# Patient Record
Sex: Male | Born: 1950 | Race: Black or African American | Hispanic: No | Marital: Single | Smoking: Former smoker
Health system: Southern US, Community
[De-identification: ages and names within clinical notes are randomized; demographics above are authoritative.]

## PROBLEM LIST (undated history)

## (undated) DIAGNOSIS — E119 Type 2 diabetes mellitus without complications: Secondary | ICD-10-CM

## (undated) DIAGNOSIS — E785 Hyperlipidemia, unspecified: Secondary | ICD-10-CM

## (undated) DIAGNOSIS — I10 Essential (primary) hypertension: Secondary | ICD-10-CM

## (undated) HISTORY — PX: KNEE SURGERY: SHX244

---

## 2005-03-15 ENCOUNTER — Ambulatory Visit: Payer: Self-pay | Admitting: Internal Medicine

## 2005-05-23 ENCOUNTER — Ambulatory Visit: Payer: Self-pay | Admitting: Family Medicine

## 2005-07-24 ENCOUNTER — Ambulatory Visit: Payer: Self-pay | Admitting: Internal Medicine

## 2006-02-28 ENCOUNTER — Ambulatory Visit: Payer: Self-pay | Admitting: *Deleted

## 2006-03-12 ENCOUNTER — Ambulatory Visit: Payer: Self-pay | Admitting: Internal Medicine

## 2006-09-18 ENCOUNTER — Ambulatory Visit: Payer: Self-pay | Admitting: Internal Medicine

## 2007-02-08 ENCOUNTER — Ambulatory Visit: Payer: Self-pay | Admitting: Internal Medicine

## 2007-05-22 ENCOUNTER — Encounter (INDEPENDENT_AMBULATORY_CARE_PROVIDER_SITE_OTHER): Payer: Self-pay | Admitting: *Deleted

## 2007-10-04 ENCOUNTER — Ambulatory Visit: Payer: Self-pay | Admitting: Family Medicine

## 2007-12-23 ENCOUNTER — Encounter: Payer: Self-pay | Admitting: Family Medicine

## 2007-12-23 ENCOUNTER — Ambulatory Visit: Payer: Self-pay | Admitting: Internal Medicine

## 2007-12-23 LAB — CONVERTED CEMR LAB
Albumin: 4.5 g/dL (ref 3.5–5.2)
CO2: 24 meq/L (ref 19–32)
Calcium: 9.7 mg/dL (ref 8.4–10.5)
Cholesterol: 180 mg/dL (ref 0–200)
Eosinophils Relative: 2 % (ref 0–5)
Glucose, Bld: 84 mg/dL (ref 70–99)
HCT: 41.1 % (ref 39.0–52.0)
Lymphocytes Relative: 27 % (ref 12–46)
Lymphs Abs: 2.7 10*3/uL (ref 0.7–4.0)
Microalb, Ur: 0.62 mg/dL (ref 0.00–1.89)
PSA: 0.28 ng/mL (ref 0.10–4.00)
Platelets: 280 10*3/uL (ref 150–400)
Potassium: 4.4 meq/L (ref 3.5–5.3)
Sodium: 138 meq/L (ref 135–145)
Total Bilirubin: 0.4 mg/dL (ref 0.3–1.2)
Total Protein: 7.7 g/dL (ref 6.0–8.3)
Triglycerides: 142 mg/dL (ref <150)
WBC: 9.8 10*3/uL (ref 4.0–10.5)

## 2008-01-01 ENCOUNTER — Ambulatory Visit: Payer: Self-pay | Admitting: Internal Medicine

## 2008-06-24 ENCOUNTER — Ambulatory Visit: Payer: Self-pay | Admitting: Family Medicine

## 2016-03-25 ENCOUNTER — Emergency Department (HOSPITAL_BASED_OUTPATIENT_CLINIC_OR_DEPARTMENT_OTHER)
Admission: EM | Admit: 2016-03-25 | Discharge: 2016-03-25 | Disposition: A | Discharge status: Home / Self Care | Payer: BLUE CROSS/BLUE SHIELD | Attending: Emergency Medicine | Admitting: Emergency Medicine

## 2016-03-25 ENCOUNTER — Encounter (HOSPITAL_BASED_OUTPATIENT_CLINIC_OR_DEPARTMENT_OTHER): Payer: Self-pay | Admitting: *Deleted

## 2016-03-25 DIAGNOSIS — F1110 Opioid abuse, uncomplicated: Secondary | ICD-10-CM | POA: Diagnosis present

## 2016-03-25 DIAGNOSIS — Z87891 Personal history of nicotine dependence: Secondary | ICD-10-CM | POA: Insufficient documentation

## 2016-03-25 DIAGNOSIS — Z7984 Long term (current) use of oral hypoglycemic drugs: Secondary | ICD-10-CM | POA: Insufficient documentation

## 2016-03-25 DIAGNOSIS — E119 Type 2 diabetes mellitus without complications: Secondary | ICD-10-CM | POA: Insufficient documentation

## 2016-03-25 DIAGNOSIS — I10 Essential (primary) hypertension: Secondary | ICD-10-CM | POA: Insufficient documentation

## 2016-03-25 DIAGNOSIS — E785 Hyperlipidemia, unspecified: Secondary | ICD-10-CM | POA: Diagnosis not present

## 2016-03-25 DIAGNOSIS — Z79899 Other long term (current) drug therapy: Secondary | ICD-10-CM | POA: Insufficient documentation

## 2016-03-25 HISTORY — DX: Hyperlipidemia, unspecified: E78.5

## 2016-03-25 HISTORY — DX: Type 2 diabetes mellitus without complications: E11.9

## 2016-03-25 HISTORY — DX: Essential (primary) hypertension: I10

## 2016-03-25 NOTE — ED Notes (Signed)
Pt reports 3 year addiction to methadone pills that he has been buying on the street. Pt states he would like to go through rehab. Last dose was taken this morning. Denies pain.

## 2016-03-25 NOTE — ED Provider Notes (Signed)
CSN: [id number]     Arrival date & time 03/25/16  1311 History   First MD Initiated Contact with Patient 03/25/16 1328     Chief Complaint  Patient presents with  . Addiction Problem     (Consider location/radiation/quality/duration/timing/severity/associated sxs/prior Treatment) HPI   Patient is a 65 year old male past medical history of diabetes, hypertension and hyperlipidemia presents to the ED requesting detox from methadone. Patient reports he has been addicted to methadone pills for the past 3 years which he states he has been buying off the street. He notes he typically takes three 10 mg tablets daily. He notes he last took 10 mg this morning. Patient denies any pain or complaints at this time. Denies alcohol or IV drug use. Patient denies using any other drugs. Denies smoking.   Past Medical History  Diagnosis Date  . Diabetes mellitus without complication (HCC)   . Hyperlipidemia   . Hypertension    Past Surgical History  Procedure Laterality Date  . Knee surgery     History reviewed. No pertinent family history. Social History  Substance Use Topics  . Smoking status: Former Games developer  . Smokeless tobacco: None  . Alcohol Use: No    Review of Systems  All other systems reviewed and are negative.     Allergies  Review of patient's allergies indicates no known allergies.  Home Medications   Prior to Admission medications   Medication Sig Start Date End Date Taking? Authorizing Provider  fenofibrate (TRICOR) 48 MG tablet Take 48 mg by mouth daily.   Yes Historical Provider, MD  lisinopril (PRINIVIL,ZESTRIL) 30 MG tablet Take 30 mg by mouth daily.   Yes Historical Provider, MD  lovastatin (MEVACOR) 20 MG tablet Take 20 mg by mouth at bedtime.   Yes Historical Provider, MD  metFORMIN (GLUCOPHAGE) 500 MG tablet Take by mouth 2 (two) times daily with a meal.   Yes Historical Provider, MD   BP 134/84 mmHg  Pulse 96  Temp(Src) 98.7 F (37.1 C) (Oral)  Resp 20  Ht  5\' 7"  (1.702 m)  Wt 84.369 kg  BMI 29.12 kg/m2  SpO2 100% Physical Exam  Constitutional: He is oriented to person, place, and time. He appears well-developed and well-nourished. No distress.  HENT:  Head: Normocephalic and atraumatic.  Mouth/Throat: Oropharynx is clear and moist. No oropharyngeal exudate.  Eyes: Conjunctivae and EOM are normal. Right eye exhibits no discharge. Left eye exhibits no discharge. No scleral icterus.  Neck: Normal range of motion. Neck supple.  Cardiovascular: Normal rate, regular rhythm, normal heart sounds and intact distal pulses.   Pulmonary/Chest: Effort normal and breath sounds normal. No respiratory distress. He has no wheezes. He has no rales. He exhibits no tenderness.  Abdominal: Soft. Bowel sounds are normal. He exhibits no distension and no mass. There is no tenderness. There is no rebound and no guarding.  Musculoskeletal: Normal range of motion. He exhibits no edema.  Lymphadenopathy:    He has no cervical adenopathy.  Neurological: He is alert and oriented to person, place, and time.  Skin: Skin is warm and dry. He is not diaphoretic.  Psychiatric: He has a normal mood and affect. His behavior is normal. Judgment and thought content normal.  Nursing note and vitals reviewed.   ED Course  Procedures (including critical care time) Labs Review Labs Reviewed - No data to display  Imaging Review No results found. I have personally reviewed and evaluated these images and lab results as part of my medical  decision-making.   EKG Interpretation None      MDM   Final diagnoses:  Methadone use disorder, mild, abuse    Patient presents requesting detox from methadone. Patient has been taking methadone tablets PO which he has been buying off the street for the past 3 years, last used this morning. Denies any pain or complaints at this time. VSS. Exam unremarkable. I discussed with patient that we do not offer detox services in the ED. Discussed  with patient outpatient resources available and patient given resource guide providing specific information to contact outpatient resources facilities. Patient reports understanding and agreement. Patient hemodynamically stable prior to discharge.    Satira Sark [place], [place] 03/25/16 1400   Nelva Nay, MD 03/26/16 [phone]

## 2016-03-25 NOTE — ED Notes (Signed)
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 Last use this am.

## 2016-03-25 NOTE — ED Notes (Signed)
Pt given resource list for detox programs and discussed the steps he needs to take to get in one of them. Pt given opportunity to ask questions.

## 2016-03-25 NOTE — Discharge Instructions (Signed)
Follow-up with one of the outpatient resource facilities listed below for further treatment and detox.  Community Resource Guide Outpatient Counseling/Substance Abuse Adolescent [employer] 211 is a great source of information about community services available.  Access by dialing 2-1-1 from anywhere in [place], or by website -  PooledIncome.pl.   Other Local Resources (Updated 09/2015)  Crisis Hotlines   Services     Area Served  [employer]  Crisis Hotline, available 24 hours a day, 7 days a week: [phone] [facility], [place]   [facility]  Crisis Hotline, available 24 hours a day, 7 days a week: 716-518-8259 [facility], [place]  [facility]  Suicide Prevention Hotline, available 24 hours a day, 7 days a week: ([phone] [facility], [place]  [employer], available 24 hours a day, 7 days a week: [phone] [facility], [place]   [facility] Access to [employer], available 24 hours a day, 7 days a week: [phone] All   [facility]  Crisis Hotline, available 24 hours a day, 7 days a week: 615-121-4381 All   Other Local Resources (Updated 09/2015)  Outpatient Counseling/ Substance Abuse Programs  Services     Address and Phone Number  [facility]  Offers individual counseling [phone] [address] [place], [place] [address]  [facility]  Offers individual counseling  Accepts Medicare, private pay, and private insurance [phone] [address] [place], [place] [address]  [facility]  Provides individual counseling, substance abuse intensive outpatient program (several hours a day, several days a week), day treatment program, and school-based therapy  Delene Loll, Medicaid, private insurance [phone] [address] [place], [place]  [address]  [facility]  Offers individual counseling, family counseling, group therapy, substance abuse intensive outpatient program (several hours a day, several days a week), and a partial hospitalization program [phone] [address] [place], [place] [address]  [phone] [address] [place], [place] [address]  [phone] [address] [place], [place] [address]  [phone] [address] [place] [address] [place], [place] [address]  [facility]  Offers individual and family counseling  Accepts Medicaid and private insurance  Offers a sliding scale for uninsured [phone] [address] [place], [place] [address]  [facility]  Accepts private insurance [phone] [address] [place], [place] [address]  [facility] [employer]  Offers individual counseling and intensive in-home services [phone] [address] [place], [place] [address]  [facility] individual counseling, family counseling, group therapy, domestic violence counseling  Accepts Medicaid and private insurance  Offers sliding scale for uninsured [phone] [address] [place], [place] [address]  [phone] [facility], [address] [place], [place] [address]  [facility]  Offers individual counseling, family counseling group therapy, and school-based therapy  3 locations - [place], [place], and [place] [phone]  [address] [place], [place] [address]  [address] [place], [place] [address]  [address] [place], [place] [address]  [facility]  Offers individual and family counseling  Accepts [place] and private insurance  Offers sliding scale for uninsured [phone] [address] [place], [place] [address]  Len Blalock, MD  Accepts private insurance [phone] [address] [place], [place]  [address]  [facility]   Offers outpatient substance abuse  counseling, intensive outpatient substance abuse programs (several hours a day, several days a week), and residential substance abuse treatment  [phone], or [phone] [address] [place], [place]  [facility]  Accepts private insurance [phone] [address] [place], [place] [address]  [facility]  Accepts Medicare and private insurance [phone] [address] [place], [place] [address]  [facility]    Offers intensive outpatient program (several hours a day, several days a week)  Accepts private pay and private insurance [phone] [facility] [place], [place]  [facility]    Offers intensive outpatient program (several hours a day, several days a week), and partial hospitalization program [phone] [address] [place], [place] [address]  [facility] counseling  Offers individual and family counseling  Accepts private insurance  Offers sliding scale for uninsured [phone] [address] [place], [place] [address]  [facility] [phone] [address] [place], [place] [address]  [facility]  Offers individual and family counseling  Offers LGBTQ services  Accepts private insurance and private pay [phone] [address] [place], [place] [address]  [facility]  Offers individual and family counseling  Accepts private insurance [phone] [address] [place], [place] [address]  [facility]   Adolescent Substance Abuse Program (ASAP): [phone]  [facility] [employer] Structured Day Program: [phone] [place], [place]  [facility]  Serves children ages 81 - 68 and their families  Offers intensive in-home treatment and residential  programs [phone] [address] [place], [place] [address]

## 2016-10-01 ENCOUNTER — Emergency Department (HOSPITAL_BASED_OUTPATIENT_CLINIC_OR_DEPARTMENT_OTHER)
Admission: EM | Admit: 2016-10-01 | Discharge: 2016-10-01 | Disposition: A | Discharge status: Home / Self Care | Payer: BLUE CROSS/BLUE SHIELD | Attending: Emergency Medicine | Admitting: Emergency Medicine

## 2016-10-01 ENCOUNTER — Encounter (HOSPITAL_BASED_OUTPATIENT_CLINIC_OR_DEPARTMENT_OTHER): Payer: Self-pay | Admitting: Emergency Medicine

## 2016-10-01 ENCOUNTER — Emergency Department (HOSPITAL_BASED_OUTPATIENT_CLINIC_OR_DEPARTMENT_OTHER): Payer: BLUE CROSS/BLUE SHIELD

## 2016-10-01 DIAGNOSIS — Z87891 Personal history of nicotine dependence: Secondary | ICD-10-CM | POA: Insufficient documentation

## 2016-10-01 DIAGNOSIS — R0789 Other chest pain: Secondary | ICD-10-CM | POA: Diagnosis not present

## 2016-10-01 DIAGNOSIS — R0981 Nasal congestion: Secondary | ICD-10-CM

## 2016-10-01 DIAGNOSIS — E119 Type 2 diabetes mellitus without complications: Secondary | ICD-10-CM | POA: Diagnosis not present

## 2016-10-01 DIAGNOSIS — I10 Essential (primary) hypertension: Secondary | ICD-10-CM | POA: Diagnosis not present

## 2016-10-01 DIAGNOSIS — Z7984 Long term (current) use of oral hypoglycemic drugs: Secondary | ICD-10-CM | POA: Insufficient documentation

## 2016-10-01 DIAGNOSIS — Z7982 Long term (current) use of aspirin: Secondary | ICD-10-CM | POA: Diagnosis not present

## 2016-10-01 DIAGNOSIS — K5900 Constipation, unspecified: Secondary | ICD-10-CM | POA: Diagnosis not present

## 2016-10-01 DIAGNOSIS — Z79899 Other long term (current) drug therapy: Secondary | ICD-10-CM | POA: Insufficient documentation

## 2016-10-01 DIAGNOSIS — R079 Chest pain, unspecified: Secondary | ICD-10-CM | POA: Diagnosis present

## 2016-10-01 LAB — BASIC METABOLIC PANEL
Anion gap: 7 (ref 5–15)
BUN: 19 mg/dL (ref 6–20)
CO2: 32 mmol/L (ref 22–32)
Calcium: 9.1 mg/dL (ref 8.9–10.3)
Chloride: 102 mmol/L (ref 101–111)
Creatinine, Ser: 1.34 mg/dL — ABNORMAL HIGH (ref 0.61–1.24)
GFR calc non Af Amer: 54 mL/min — ABNORMAL LOW (ref >60)
Glucose, Bld: 98 mg/dL (ref 65–99)
POTASSIUM: 3.8 mmol/L (ref 3.5–5.1)
Sodium: 141 mmol/L (ref 135–145)

## 2016-10-01 LAB — CBC
HEMATOCRIT: 34.2 % — AB (ref 39.0–52.0)
HEMOGLOBIN: 11.1 g/dL — AB (ref 13.0–17.0)
MCH: 28.2 pg (ref 26.0–34.0)
MCHC: 32.5 g/dL (ref 30.0–36.0)
MCV: 87 fL (ref 78.0–100.0)
Platelets: 241 10*3/uL (ref 150–400)
RBC: 3.93 MIL/uL — AB (ref 4.22–5.81)
RDW: 13.4 % (ref 11.5–15.5)
WBC: 9.7 10*3/uL (ref 4.0–10.5)

## 2016-10-01 LAB — TROPONIN I: Troponin I: <0.03 ng/mL (ref <0.03)

## 2016-10-01 MED ORDER — LORATADINE 10 MG PO TABS: 10.0000 mg | ORAL_TABLET | Freq: Every day | ORAL | 0 refills | Status: AC

## 2016-10-01 MED ORDER — DOCUSATE SODIUM 100 MG PO CAPS: 100.0000 mg | ORAL_CAPSULE | Freq: Every day | ORAL | 0 refills | Status: AC | PRN

## 2016-10-01 MED ORDER — NAPROXEN 500 MG PO TABS: 500.0000 mg | ORAL_TABLET | Freq: Two times a day (BID) | ORAL | 0 refills | Status: AC

## 2016-10-01 NOTE — ED Triage Notes (Addendum)
Patient reports intermittent chest tightness of the past 1.5 weeks.  Reports shortness of breath with this.  Denies N/V.  States that he has also recently gotten over a cold and is not sure if this is related.  Reports intermittent right leg numbness as well which began 2 months ago.   Reports pain when he sleeps on this leg.

## 2016-10-01 NOTE — ED Provider Notes (Addendum)
[facility] Provider Note   CSN: [id number] Arrival date & time: 10/01/16  0809     History   Chief Complaint Chief Complaint  Patient presents with  . Chest Pain  . Leg Pain    HPI Rickey Clark is a 66 y.o. male.  The history is provided by the patient.  Chest Pain   This is a new problem. Episode onset: 10 days ago. The problem occurs constantly. The problem has not changed since onset.The pain is associated with raising an arm and movement. The pain is present in the lateral region. The pain is moderate. The quality of the pain is described as dull. The pain does not radiate. Duration of episode(s) is 10 days. The symptoms are aggravated by certain positions and deep breathing. Associated symptoms include sputum production. Pertinent negatives include no cough, no exertional chest pressure, no fever and no shortness of breath. He has tried nothing for the symptoms. The treatment provided no relief. Risk factors include being elderly and male gender.  His past medical history is significant for diabetes, hyperlipidemia and hypertension.    Past Medical History:  Diagnosis Date  . Diabetes mellitus without complication (HCC)   . Hyperlipidemia   . Hypertension     There are no active problems to display for this patient.   Past Surgical History:  Procedure Laterality Date  . KNEE SURGERY         Home Medications    Prior to Admission medications   Medication Sig Start Date End Date Taking? Authorizing Provider  ASPIRIN 81 PO Take by mouth.   Yes Historical Provider, MD  methadone (DOLOPHINE) 10 MG tablet Take 10 mg by mouth daily.   Yes Historical Provider, MD  fenofibrate (TRICOR) 48 MG tablet Take 48 mg by mouth daily.    Historical Provider, MD  lisinopril (PRINIVIL,ZESTRIL) 30 MG tablet Take 30 mg by mouth daily.    Historical Provider, MD  lovastatin (MEVACOR) 20 MG tablet Take 20 mg by mouth at bedtime.    Historical Provider, MD  metFORMIN  (GLUCOPHAGE) 500 MG tablet Take by mouth 2 (two) times daily with a meal.    Historical Provider, MD    Family History History reviewed. No pertinent family history.  Social History Social History  Substance Use Topics  . Smoking status: Former Games developermoker  . Smokeless tobacco: Never Used  . Alcohol use No     Allergies   Patient has no known allergies.   Review of Systems Review of Systems  Constitutional: Negative for fever.  Respiratory: Positive for sputum production. Negative for cough and shortness of breath.   Cardiovascular: Positive for chest pain.  All other systems reviewed and are negative.    Physical Exam Updated Vital Signs BP 140/89 (BP Location: Right Arm)   Pulse 83   Temp 98 F (36.7 C) (Oral)   Resp 16   Ht 5' 7.5" (1.715 m)   Wt 190 lb (86.2 kg)   SpO2 98%   BMI 29.32 kg/m   Physical Exam  Constitutional: He is oriented to person, place, and time. He appears well-developed and well-nourished. No distress.  HENT:  Head: Normocephalic and atraumatic.  Nose: Nose normal.  Eyes: Conjunctivae are normal.  Neck: Neck supple. No tracheal deviation present.  Cardiovascular: Normal rate, regular rhythm and normal heart sounds.   Pulmonary/Chest: Effort normal and breath sounds normal. No respiratory distress. He exhibits tenderness (left pectoralis).  Abdominal: Soft. He exhibits no distension.  Musculoskeletal:  Normal range of motion. He exhibits no edema.  Neurological: He is alert and oriented to person, place, and time.  Skin: Skin is warm and dry. Capillary refill takes less than 2 seconds.  Psychiatric: He has a normal mood and affect.  Vitals reviewed.    ED Treatments / Results  Labs  (all labs ordered are listed, but only abnormal results are displayed) Labs Reviewed  BASIC METABOLIC PANEL - Abnormal; Notable for the following:       Result Value   Creatinine, Ser 1.34 (*)    GFR calc non Af Amer 54 (*)    All other components within  normal limits  CBC - Abnormal; Notable for the following:    RBC 3.93 (*)    Hemoglobin 11.1 (*)    HCT 34.2 (*)    All other components within normal limits  TROPONIN I    EKG  EKG Interpretation  Date/Time:  Sunday October 01 2016 08:25:44 EST Ventricular Rate:  78 PR Interval:    QRS Duration: 84 QT Interval:  379 QTC Calculation: 432 R Axis:   -17 Text Interpretation:  Sinus rhythm Atrial premature complex Probable left atrial enlargement Borderline left axis deviation Borderline T wave abnormalities No previous tracing Confirmed by Almer Bushey MD, Jacoby Zanni ([id number]) on 10/01/2016 8:37:54 AM       Radiology Dg Chest 2 View  Result Date: 10/01/2016 CLINICAL DATA:  Chest pain, shortness of breath EXAM: CHEST  2 VIEW COMPARISON:  None. FINDINGS: Heart and mediastinal contours are within normal limits. No focal opacities or effusions. No acute bony abnormality. IMPRESSION: No active cardiopulmonary disease. Electronically Signed   By: Kevin  Dover M.D.   On: 10/01/2016 09:10    Procedures Procedures (including critical care time)  Medications Ordered in ED Medications - No data to display   Initial Impression / Assessment and Plan / ED Course  I have reviewed the triage vital signs and the nursing notes.  Pertinent labs & imaging results that were available during my care of the patient were reviewed by me and considered in my medical decision making (see chart for details).     65  y.o. male presents with pain over left pectoralis muscle that is reproducible and atypical in nature. Troponin is negative despite >1 week of ongoing pain that is current. Has secondary complaints of nasal congestion, cough, and hard to pass stools. Provided NSAIDs for pain, colace for stools and antihistamine for nasal congestion. Plan to follow up with PCP as needed and return precautions discussed for worsening or new concerning symptoms.   Final Clinical Impressions(s) / ED Diagnoses   Final  diagnoses:  Constipation, unspecified constipation type  Chest wall pain  Sinus congestion    New Prescriptions Discharge Medication List as of 10/01/2016 10:55 AM    START taking these medications   Details  docusate sodium (COLACE) 100 MG capsule Take 1 capsule (100 mg total) by mouth daily as needed for mild constipation., Starting Sun 10/01/2016, Print    loratadine (CLARITIN) 10 MG tablet Take 1 tablet (10 mg total) by mouth daily., Starting Sun 10/01/2016, Until Tue 10/31/2016, Print    naproxen (NAPROSYN) 500 MG tablet Take 1 tablet (500 mg total) by mouth 2 (two) times daily with a meal., Starting Sun 10/01/2016, Until Fri 10/06/2016, Print         Lyndal Pulley, MD 10/02/16 1453    Lyndal Pulley, MD 10/02/16 1454

## 2017-03-12 ENCOUNTER — Emergency Department (HOSPITAL_BASED_OUTPATIENT_CLINIC_OR_DEPARTMENT_OTHER): Payer: BLUE CROSS/BLUE SHIELD

## 2017-03-12 ENCOUNTER — Encounter (HOSPITAL_BASED_OUTPATIENT_CLINIC_OR_DEPARTMENT_OTHER): Payer: Self-pay | Admitting: Emergency Medicine

## 2017-03-12 ENCOUNTER — Emergency Department (HOSPITAL_BASED_OUTPATIENT_CLINIC_OR_DEPARTMENT_OTHER)
Admission: EM | Admit: 2017-03-12 | Discharge: 2017-03-12 | Disposition: A | Discharge status: Home / Self Care | Payer: BLUE CROSS/BLUE SHIELD | Attending: Emergency Medicine | Admitting: Emergency Medicine

## 2017-03-12 DIAGNOSIS — R0981 Nasal congestion: Secondary | ICD-10-CM | POA: Diagnosis not present

## 2017-03-12 DIAGNOSIS — Z7984 Long term (current) use of oral hypoglycemic drugs: Secondary | ICD-10-CM | POA: Insufficient documentation

## 2017-03-12 DIAGNOSIS — I10 Essential (primary) hypertension: Secondary | ICD-10-CM | POA: Diagnosis not present

## 2017-03-12 DIAGNOSIS — Z87891 Personal history of nicotine dependence: Secondary | ICD-10-CM | POA: Diagnosis not present

## 2017-03-12 DIAGNOSIS — E119 Type 2 diabetes mellitus without complications: Secondary | ICD-10-CM | POA: Diagnosis not present

## 2017-03-12 DIAGNOSIS — Z79899 Other long term (current) drug therapy: Secondary | ICD-10-CM | POA: Diagnosis not present

## 2017-03-12 MED ORDER — IPRATROPIUM-ALBUTEROL 0.5-2.5 (3) MG/3ML IN SOLN
RESPIRATORY_TRACT | Status: AC
  Filled 2017-03-12: qty 3

## 2017-03-12 MED ORDER — ALBUTEROL SULFATE (2.5 MG/3ML) 0.083% IN NEBU
2.5000 mg | INHALATION_SOLUTION | Freq: Once | RESPIRATORY_TRACT | Status: AC
  Administered 2017-03-12: 2.5 mg via RESPIRATORY_TRACT

## 2017-03-12 MED ORDER — ALBUTEROL SULFATE (2.5 MG/3ML) 0.083% IN NEBU
INHALATION_SOLUTION | RESPIRATORY_TRACT | Status: AC
  Filled 2017-03-12: qty 3

## 2017-03-12 MED ORDER — IPRATROPIUM-ALBUTEROL 0.5-2.5 (3) MG/3ML IN SOLN
3.0000 mL | Freq: Four times a day (QID) | RESPIRATORY_TRACT | Status: DC
  Administered 2017-03-12: 3 mL via RESPIRATORY_TRACT

## 2017-03-12 NOTE — ED Triage Notes (Signed)
Pt c/o congestion, throat irritation, dry cough x 1 month. Also reports fullness in his head. Denies fevers, or SOB.

## 2017-03-12 NOTE — ED Provider Notes (Signed)
[facility] Provider Note: Lowella Dell, MD, FACEP  CSN: [id number] MRN: [id number] ARRIVAL: 03/12/17 at 0320 ROOM: [facility]01/[facility]01   CHIEF COMPLAINT  Nasal Congestion   HISTORY OF PRESENT ILLNESS  Rickey Clark is a 66 y.o. male who is complaining of a dry cough and a dry throat for about the last month. He is also having congestion in his head. He denies difficulty breathing through his nose but feels his ears are congested and his head feels full. He denies fever or shortness of breath. He has been taking Claritin-D without relief of his symptoms. The congestion was severe enough to keep him from sleeping overnight.  His nurse found him to have wheezes in his bases and he was given a DuoNeb treatment prior to my evaluation. He denies significant change.   Past Medical History:  Diagnosis Date  . Diabetes mellitus without complication (HCC)   . Hyperlipidemia   . Hypertension     Past Surgical History:  Procedure Laterality Date  . KNEE SURGERY      No family history on file.  Social History  Substance Use Topics  . Smoking status: Former Games developer  . Smokeless tobacco: Never Used  . Alcohol use No    Prior to Admission medications   Medication Sig Start Date End Date Taking? Authorizing Provider  amlodipine-atorvastatin (CADUET) 10-10 MG tablet Take 1 tablet by mouth daily.   Yes [provider]  ASPIRIN 81 PO Take by mouth.    [provider]  docusate sodium (COLACE) 100 MG capsule Take 1 capsule (100 mg total) by mouth daily as needed for mild constipation. 10/01/16   Lyndal Pulley, MD  fenofibrate (TRICOR) 48 MG tablet Take 48 mg by mouth daily.    [provider]  lisinopril (PRINIVIL,ZESTRIL) 30 MG tablet Take 30 mg by mouth daily.    [provider]  loratadine (CLARITIN) 10 MG tablet Take 1 tablet (10 mg total) by mouth daily. 10/01/16 10/31/16  Lyndal Pulley, MD  lovastatin (MEVACOR) 20 MG tablet Take 20 mg by mouth at  bedtime.    [provider]  metFORMIN (GLUCOPHAGE) 500 MG tablet Take by mouth 2 (two) times daily with a meal.    [provider]  methadone (DOLOPHINE) 10 MG tablet Take 10 mg by mouth daily.    [provider]    Allergies Patient has no known allergies.   REVIEW OF SYSTEMS  Negative except as noted here or in the History of Present Illness.   PHYSICAL EXAMINATION  Initial Vital Signs Blood pressure (!) 145/92, pulse (!) 111, temperature 98.7 F (37.1 C), temperature source Oral, resp. rate 16, height 5\' 7"  (1.702 m), weight 87.1 kg (192 lb), SpO2 99 %.  Examination General: Well-developed, well-nourished male in no acute distress; appearance consistent with age of record HENT: normocephalic; atraumatic; pharynx normal; dentures; TMs normal; no nasal congestion Eyes: pupils equal, round and reactive to light; extraocular muscles intact Neck: supple Heart: regular rate and rhythm Lungs: clear to auscultation bilaterally Abdomen: soft; nondistended; nontender; nontender, reducible ventral hernia; bowel sounds present Extremities: No deformity; full range of motion; pulses normal Neurologic: Awake, alert and oriented; motor function intact in all extremities and symmetric; no facial droop Skin: Warm and dry Psychiatric: Flat affect   RESULTS  Summary of this visit's results, reviewed by myself:   EKG Interpretation  Date/Time:    Ventricular Rate:    PR Interval:    QRS Duration:   QT Interval:  QTC Calculation:   R Axis:     Text Interpretation:        Laboratory Studies: No results found for this or any previous visit (from the past 24 hour(s)). Imaging Studies: Ct Maxillofacial Wo Contrast  Result Date: 03/12/2017 CLINICAL DATA:  Initial evaluation for congestion, throat irritation, dry cough. Your pressure. EXAM: CT MAXILLOFACIAL WITHOUT CONTRAST TECHNIQUE: Multidetector CT imaging of the maxillofacial structures was performed.  Multiplanar CT image reconstructions were also generated. A small metallic BB was placed on the right temple in order to reliably differentiate right from left. COMPARISON:  None. FINDINGS: Osseous: No acute osseous abnormality within the face. Patient is edentulous. Mild S word bowing of the nasal septum noted. Orbits: Globes and orbital soft tissues within normal limits. Sinuses: Paranasal sinuses are clear. No significant mucoperiosteal thickening. No air-fluid levels to suggest acute sinusitis. No significant osseous thickening or sclerosis to suggest chronic sinusitis. Mastoid air cells and middle ear cavities are clear. Soft tissues: No acute soft tissue abnormality within the face. Salivary glands within normal limits. Oral cavity and visualized pharynx within normal limits. No nasopharyngeal mass. Limited intracranial: Unremarkable. IMPRESSION: Unremarkable maxillofacial CT. Clear paranasal sinuses and mastoid air cells. Electronically Signed   By: Rise MuBenjamin  McClintock M.D.   On: 03/12/2017 04:46    ED COURSE  Nursing notes and initial vitals signs, including pulse oximetry, reviewed.  Vitals:   03/12/17 0327 03/12/17 0328 03/12/17 0354  BP: (!) 145/92    Pulse: (!) 111    Resp: 16    Temp: 98.7 F (37.1 C)    TempSrc: Oral    SpO2: 99%  96%  Weight:  87.1 kg (192 lb)   Height:  5\' 7"  (1.702 m)    4:54 AM Patient advised of reassuring CT scan. He will consult with his PCP regarding whether his symptoms may be side effects of his medications.  PROCEDURES    ED DIAGNOSES     ICD-10-CM   1. Head congestion R09.81        Cristiano Capri, Jonny RuizJohn, MD 03/12/17 [phone]

## 2017-08-25 ENCOUNTER — Emergency Department (HOSPITAL_COMMUNITY)
Admission: EM | Admit: 2017-08-25 | Discharge: 2017-08-25 | Disposition: A | Discharge status: Home / Self Care | Payer: Medicare Other | Attending: Emergency Medicine | Admitting: Emergency Medicine

## 2017-08-25 ENCOUNTER — Encounter (HOSPITAL_COMMUNITY): Payer: Self-pay | Admitting: Emergency Medicine

## 2017-08-25 ENCOUNTER — Emergency Department (HOSPITAL_COMMUNITY): Payer: Medicare Other

## 2017-08-25 ENCOUNTER — Other Ambulatory Visit: Payer: Self-pay

## 2017-08-25 DIAGNOSIS — R05 Cough: Secondary | ICD-10-CM | POA: Diagnosis not present

## 2017-08-25 DIAGNOSIS — Z7982 Long term (current) use of aspirin: Secondary | ICD-10-CM | POA: Insufficient documentation

## 2017-08-25 DIAGNOSIS — E785 Hyperlipidemia, unspecified: Secondary | ICD-10-CM | POA: Diagnosis not present

## 2017-08-25 DIAGNOSIS — I10 Essential (primary) hypertension: Secondary | ICD-10-CM | POA: Insufficient documentation

## 2017-08-25 DIAGNOSIS — Z7984 Long term (current) use of oral hypoglycemic drugs: Secondary | ICD-10-CM | POA: Diagnosis not present

## 2017-08-25 DIAGNOSIS — R059 Cough, unspecified: Secondary | ICD-10-CM

## 2017-08-25 DIAGNOSIS — E119 Type 2 diabetes mellitus without complications: Secondary | ICD-10-CM | POA: Insufficient documentation

## 2017-08-25 DIAGNOSIS — J029 Acute pharyngitis, unspecified: Secondary | ICD-10-CM | POA: Diagnosis present

## 2017-08-25 DIAGNOSIS — Z87891 Personal history of nicotine dependence: Secondary | ICD-10-CM | POA: Diagnosis not present

## 2017-08-25 LAB — CBC WITH DIFFERENTIAL/PLATELET
BASOS ABS: 0 10*3/uL (ref 0.0–0.1)
Basophils Relative: 0 %
Eosinophils Absolute: 0.2 10*3/uL (ref 0.0–0.7)
Eosinophils Relative: 2 %
HEMATOCRIT: 47.2 % (ref 39.0–52.0)
Hemoglobin: 16.4 g/dL (ref 13.0–17.0)
Lymphocytes Relative: 18 %
Lymphs Abs: 2.5 10*3/uL (ref 0.7–4.0)
MCH: 28.7 pg (ref 26.0–34.0)
MCHC: 34.7 g/dL (ref 30.0–36.0)
MCV: 82.7 fL (ref 78.0–100.0)
Monocytes Absolute: 1.3 10*3/uL — ABNORMAL HIGH (ref 0.1–1.0)
Monocytes Relative: 9 %
NEUTROS ABS: 9.8 10*3/uL — AB (ref 1.7–7.7)
NEUTROS PCT: 71 %
PLATELETS: 307 10*3/uL (ref 150–400)
RBC: 5.71 MIL/uL (ref 4.22–5.81)
RDW: 15.8 % — AB (ref 11.5–15.5)
WBC: 13.8 10*3/uL — ABNORMAL HIGH (ref 4.0–10.5)

## 2017-08-25 LAB — I-STAT CHEM 8, ED
BUN: 17 mg/dL (ref 6–20)
CALCIUM ION: 1.14 mmol/L — AB (ref 1.15–1.40)
CREATININE: 1.3 mg/dL — AB (ref 0.61–1.24)
Chloride: 99 mmol/L — ABNORMAL LOW (ref 101–111)
GLUCOSE: 175 mg/dL — AB (ref 65–99)
HCT: 51 % (ref 39.0–52.0)
HEMOGLOBIN: 17.3 g/dL — AB (ref 13.0–17.0)
Potassium: 4.5 mmol/L (ref 3.5–5.1)
Sodium: 137 mmol/L (ref 135–145)
TCO2: 26 mmol/L (ref 22–32)

## 2017-08-25 MED ORDER — IOPAMIDOL (ISOVUE-300) INJECTION 61%
INTRAVENOUS | Status: AC
  Administered 2017-08-25: 75 mL
  Filled 2017-08-25: qty 75

## 2017-08-25 NOTE — ED Provider Notes (Signed)
[facility] EMERGENCY DEPARTMENT Provider Note   CSN: [id number] Arrival date & time: 08/25/17  0800     History   Chief Complaint Chief Complaint  Patient presents with  . Sore Throat  . Neck Pain    HPI Clelia SchaumannWilliam Golberg is a 66 y.o. male.  HPI Patient presents with sore throat and neck pain.  Has had it since August.  Has been seen by GI and his primary care doctor for without cause.  Thought to be due to reflux or potentially his blood pressure medicine.  Has been started on proton pump inhibitor and his medicines changed without relief.  States he feels as if he has to cough but nothing comes up.  Pain in his anterior throat.  Also some pain on his right neck when he turns his head.  No difficulty swallowing.  Does not feel food get caught.  No headache.  No numbness weakness.  No weight loss.  He is a former smoker.  States now it feels as if the neck is more swollen. Past Medical History:  Diagnosis Date  . Diabetes mellitus without complication (HCC)   . Hyperlipidemia   . Hypertension     There are no active problems to display for this patient.   Past Surgical History:  Procedure Laterality Date  . KNEE SURGERY         Home Medications    Prior to Admission medications   Medication Sig Start Date End Date Taking? Authorizing Provider  amLODipine (NORVASC) 10 MG tablet Take 10 mg by mouth daily.   Yes [provider]  ASPIRIN 81 PO Take by mouth.   Yes [provider]  docusate sodium (COLACE) 100 MG capsule Take 1 capsule (100 mg total) by mouth daily as needed for mild constipation. 10/01/16  Yes Lyndal PulleyKnott, Daniel, MD  fenofibrate (TRICOR) 48 MG tablet Take 48 mg by mouth daily.   Yes [provider]  lovastatin (MEVACOR) 20 MG tablet Take 20 mg by mouth at bedtime.   Yes [provider]  metFORMIN (GLUCOPHAGE) 500 MG tablet Take by mouth 2 (two) times daily with a meal.   Yes [provider]    triamterene-hydrochlorothiazide (MAXZIDE-25) 37.5-25 MG tablet Take 1 tablet by mouth daily.   Yes [provider]  loratadine (CLARITIN) 10 MG tablet Take 1 tablet (10 mg total) by mouth daily. 10/01/16 10/31/16  Lyndal PulleyKnott, Daniel, MD    Family History No family history on file.  Social History Social History   Tobacco Use  . Smoking status: Former Games developermoker  . Smokeless tobacco: Never Used  Substance Use Topics  . Alcohol use: No  . Drug use: Yes    Comment: methadone     Allergies   Patient has no known allergies.   Review of Systems Review of Systems  Constitutional: Negative for appetite change.  HENT: Positive for sore throat. Negative for dental problem, facial swelling, sinus pain and trouble swallowing.   Respiratory: Positive for cough. Negative for shortness of breath.   Cardiovascular: Negative for chest pain.  Gastrointestinal: Positive for abdominal pain.  Genitourinary: Negative for flank pain.  Musculoskeletal: Negative for back pain.  Neurological: Negative for weakness.  Psychiatric/Behavioral: Negative for confusion.     Physical Exam Updated Vital Signs BP 137/86 (BP Location: Right Arm)   Pulse 89   Temp 97.9 F (36.6 C) (Oral)   Resp 18   Ht 5\' 7"  (1.702 m)   Wt 91.6 kg (202  lb)   SpO2 97%   BMI 31.64 kg/m   Physical Exam  Constitutional: He appears well-developed.  HENT:  Head: Normocephalic.  Mouth/Throat: Mucous membranes are normal. No oral lesions. No uvula swelling. No oropharyngeal exudate.  Some anterior cervical lymphadenopathy.  Some mild swelling of bilateral tonsils.  Uvula midline.  No exudate.  No stridor.  Neck: Normal range of motion.  Cardiovascular: Regular rhythm.  Pulmonary/Chest: Effort normal and breath sounds normal.  Neurological: He is alert.  Skin: Skin is warm and dry. Capillary refill takes less than 2 seconds.  Psychiatric: He has a normal mood and affect.     ED Treatments / Results  Labs (all  labs ordered are listed, but only abnormal results are displayed) Labs Reviewed  CBC WITH DIFFERENTIAL/PLATELET - Abnormal; Notable for the following components:      Result Value   WBC 13.8 (*)    RDW 15.8 (*)    Neutro Abs 9.8 (*)    Monocytes Absolute 1.3 (*)    All other components within normal limits  I-STAT CHEM 8, ED - Abnormal; Notable for the following components:   Chloride 99 (*)    Creatinine, Ser 1.30 (*)    Glucose, Bld 175 (*)    Calcium, Ion 1.14 (*)    Hemoglobin 17.3 (*)    All other components within normal limits    EKG  EKG Interpretation None       Radiology Ct Soft Tissue Neck W Contrast  Result Date: 08/25/2017 CLINICAL DATA:  Cough, persistent. Tonsils/adenoid disorder. Symptoms for 4 months, worsening the past few weeks. EXAM: CT NECK WITH CONTRAST TECHNIQUE: Multidetector CT imaging of the neck was performed using the standard protocol following the bolus administration of intravenous contrast. CONTRAST:  75mL ISOVUE-300 IOPAMIDOL (ISOVUE-300) INJECTION 61% COMPARISON:  Maxillofacial CT 03/12/2017 FINDINGS: Pharynx and larynx: The glottis is closed, which gives a somewhat redundant appearance of the aryepiglottic folds that do not appear low-density or edematous. No inflammatory enhancement or mass is seen throughout the airway and oral cavity. 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 Salivary glands: Negative Thyroid: Possible 4 mm nodule on the right, too small for require imaging follow-up. Lymph nodes: None enlarged or abnormal density. Vascular: Negative Limited intracranial: Negative Visualized orbits: Right cataract resection.  No pathologic finding Mastoids and visualized paranasal sinuses: Clear Skeleton: C5-6, C6-7, and C7-T1 disc degeneration with posterior disc osteophyte complex encroaching on the spinal canal. Edentulous with alveolar ridge atrophy. No acute or aggressive finding. Facet spurring. Upper chest: Negative  IMPRESSION: 1. No acute finding. 2. Mildly redundant appearance of the aryepiglottic folds without visible edema, likely related to closed positioning of the glottis. Consider ENT referral for a mucosal exam given the history. 3. 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 Electronically Signed   By: Marnee Spring M.D.   On: 08/25/2017 11:51    Procedures Procedures (including critical care time)  Medications Ordered in ED Medications  iopamidol (ISOVUE-300) 61 % injection (75 mLs  Contrast Given 08/25/17 1059)     Initial Impression / Assessment and Plan / ED Course  I have reviewed the triage vital signs and the nursing notes.  Pertinent labs & imaging results that were available during my care of the patient were reviewed by me and considered in my medical decision making (see chart for details).     Patient with sore throat.  Has had for the last few months and has been seen by primary care's  and GI.  CT scan done and showed artifact versus slight colonic thickening.  Will have follow-up with ENT and pulmonology.  Discharge home.  Final Clinical Impressions(s) / ED Diagnoses   Final diagnoses:  Cough    ED Discharge Orders    None       Benjiman CorePickering, Brendolyn Stockley, MD 08/25/17 [phone]

## 2017-08-25 NOTE — ED Triage Notes (Signed)
Pt. Stated, I've had throat pain and neck pain for the last 3 months. I've seen my primary care and not coming up with anything.

## 2017-08-25 NOTE — ED Notes (Signed)
Patient given discharge instructions and verbalized understanding.  Patient stable to discharge at this time.  Patient is alert and oriented to baseline.  No distressed noted at this time.  All belongings taken with the patient at discharge.   

## 2018-03-28 IMAGING — CT CT MAXILLOFACIAL W/O CM
3 of 4 series · 16 of 47 positions shown, 19 images · non-contrast
Comparison: None.

CLINICAL DATA: Initial evaluation for congestion, throat
irritation, dry cough. Your pressure.

EXAM:
CT MAXILLOFACIAL WITHOUT CONTRAST
TECHNIQUE: Multidetector CT imaging of the maxillofacial structures was
performed. Multiplanar CT image reconstructions were also generated.
A small metallic BB was placed on the right temple in order to
reliably differentiate right from left.

[Series 2: max soft · axial · 0.38mm/px · z∈[+1338,+1488]mm · 11 of 88 slices shown, 14 images]
[im 7/88  brain]
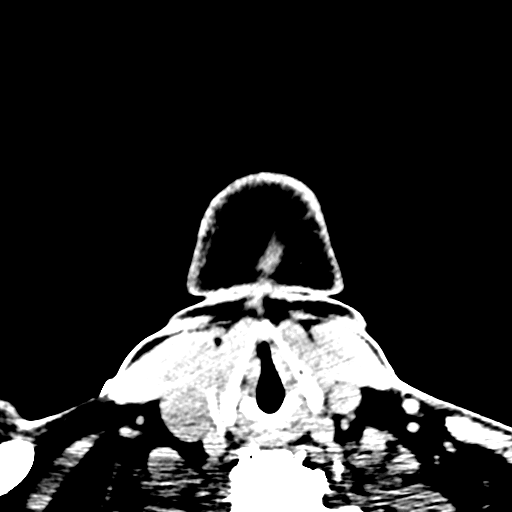
[im 7/88  bone]
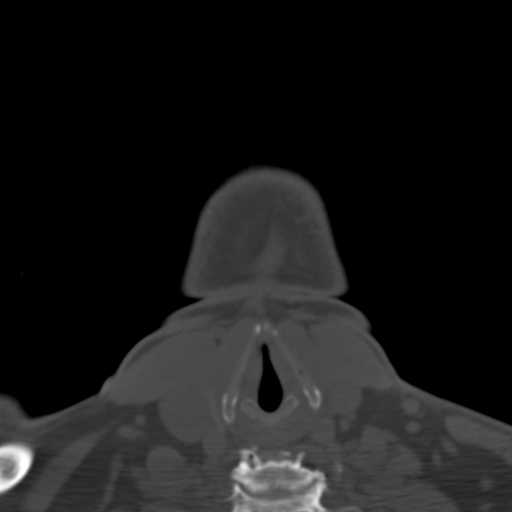
[im 13/88  bone]
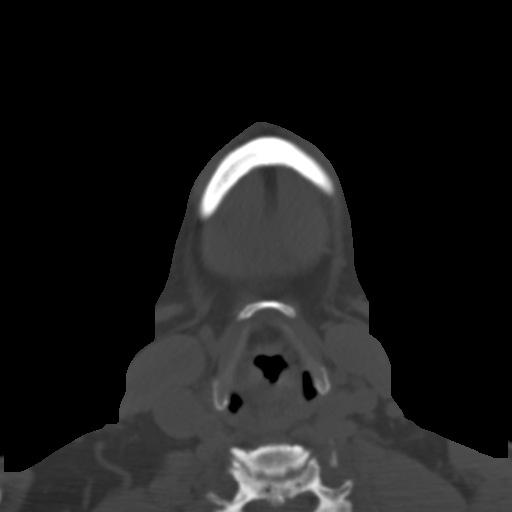
[im 22/88  bone]
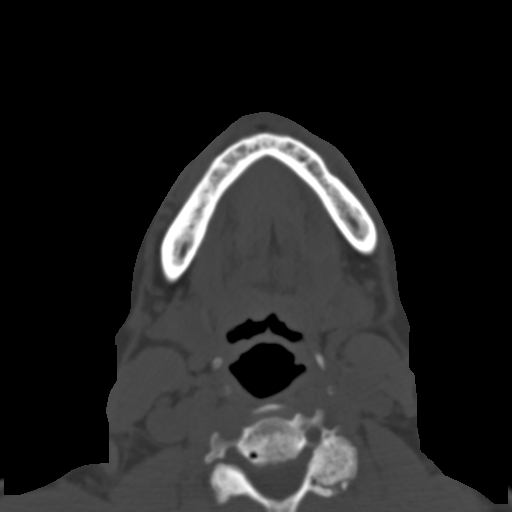
[im 28/88  bone]
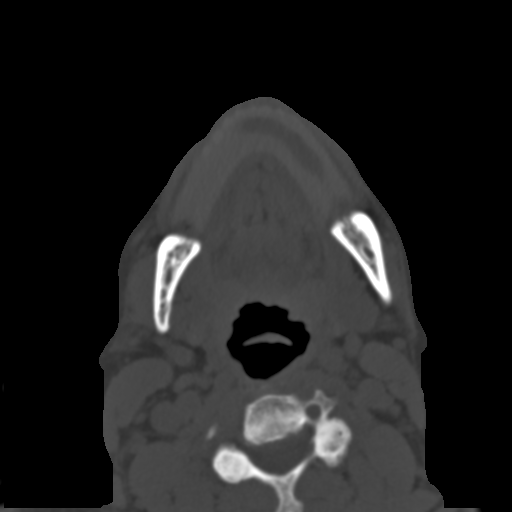
[im 37/88  brain]
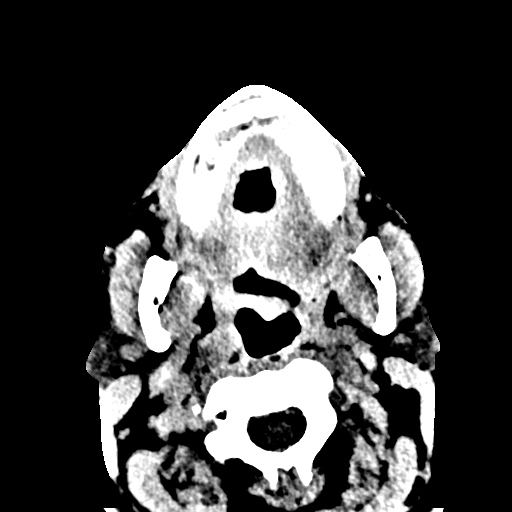
[im 37/88  bone]
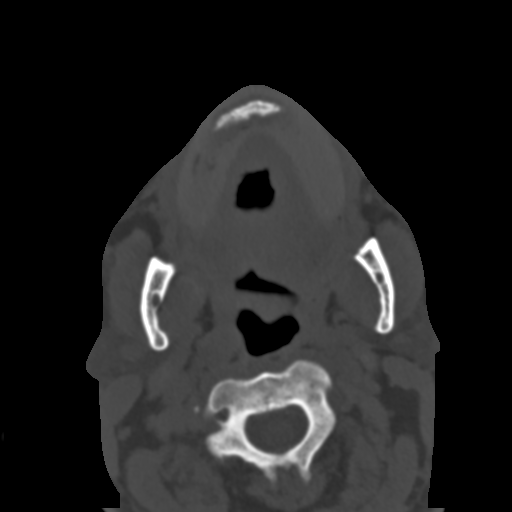
[im 46/88  bone]
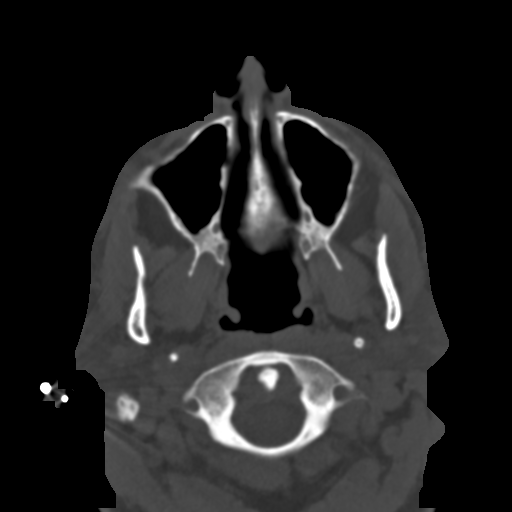
[im 52/88  bone]
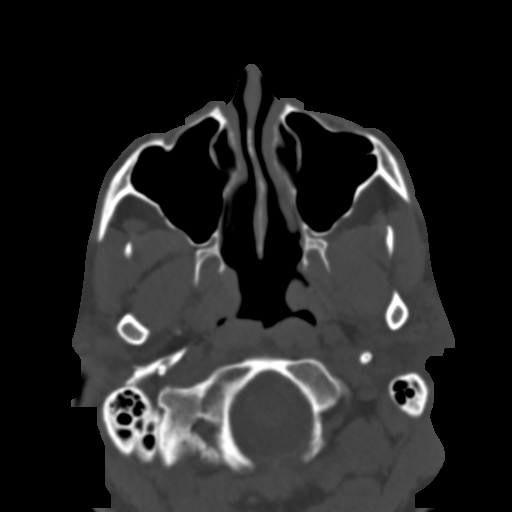
[im 61/88  bone]
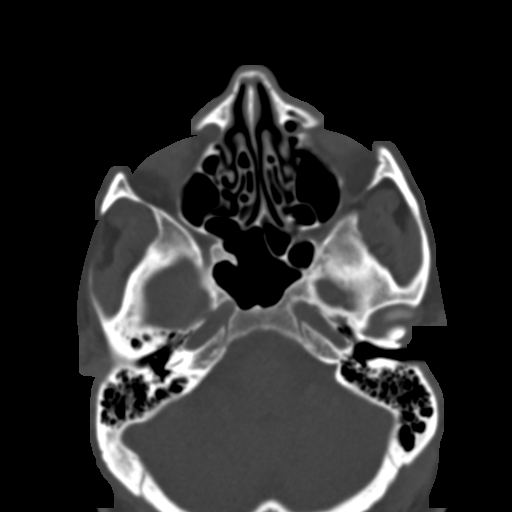
[im 67/88  brain]
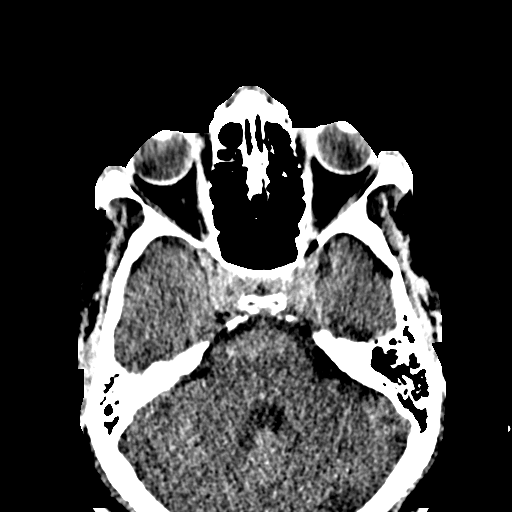
[im 67/88  bone]
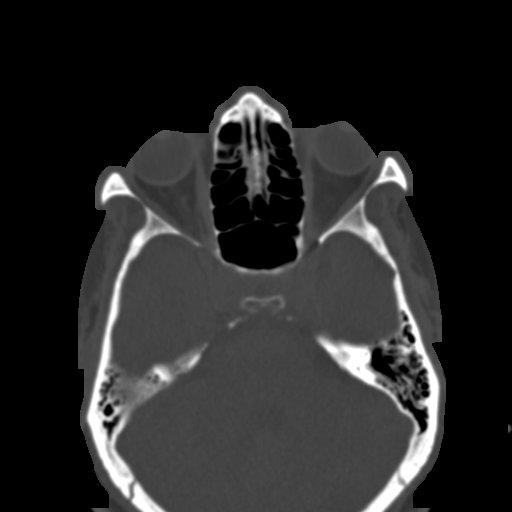
[im 76/88  bone]
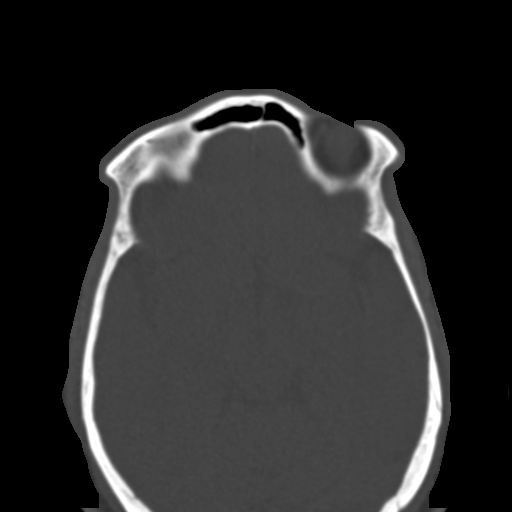
[im 82/88  bone]
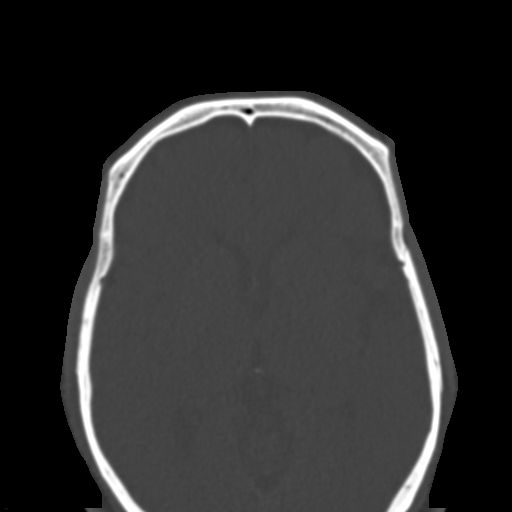

[Series 4: coronal soft · coronal · 0.36mm/px · 3 of 77 slices shown]
[im 26/77  bone]
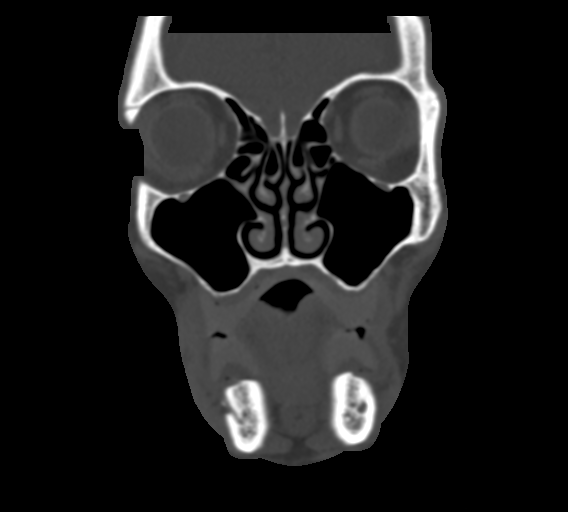
[im 34/77  bone]
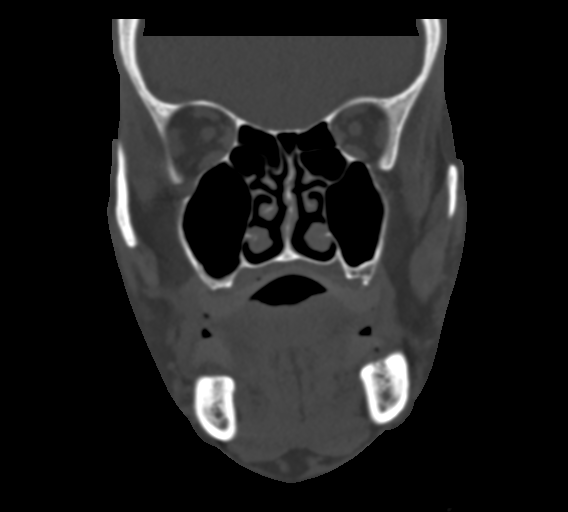
[im 43/77  bone]
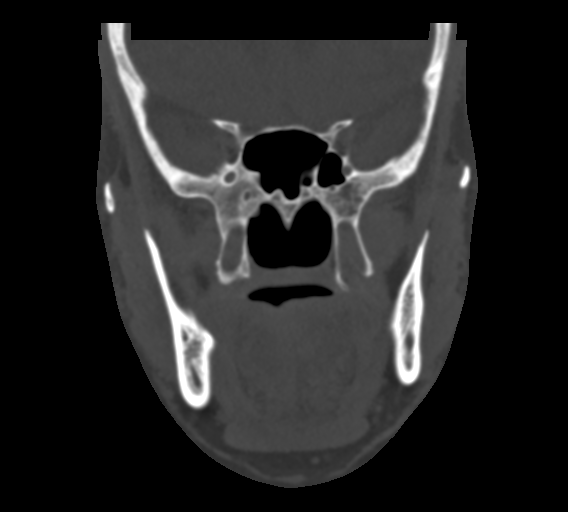

[Series 7: sagittal bone · sagittal · 0.37mm/px · 2 of 106 slices shown]
[im 36/106  bone]
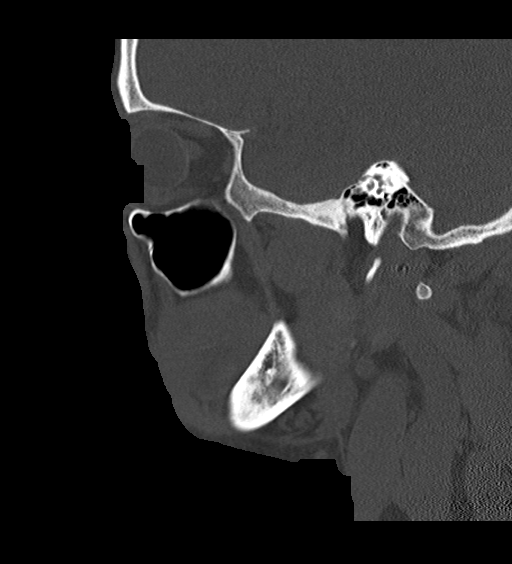
[im 71/106  bone]
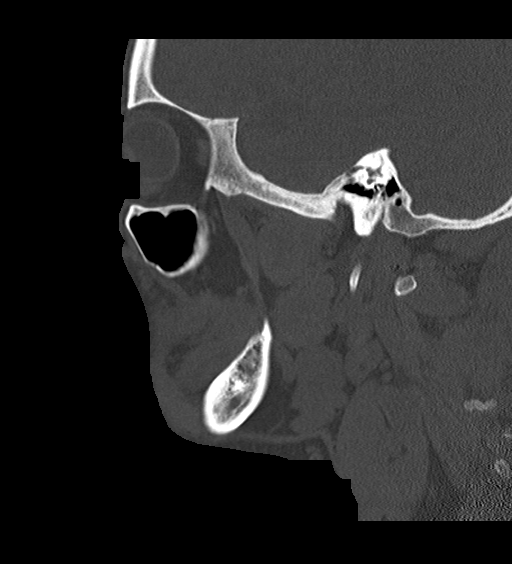

[16 of 47 positions shown; findings below may reference images not displayed]

FINDINGS: Osseous: No acute osseous abnormality within the face. Patient is
edentulous. Mild S word bowing of the nasal septum noted.

Orbits: Globes and orbital soft tissues within normal limits.

Sinuses: Paranasal sinuses are clear. No significant mucoperiosteal
thickening. No air-fluid levels to suggest acute sinusitis. No
significant osseous thickening or sclerosis to suggest chronic
sinusitis. Mastoid air cells and middle ear cavities are clear.

Soft tissues: No acute soft tissue abnormality within the face.
Salivary glands within normal limits. Oral cavity and visualized
pharynx within normal limits. No nasopharyngeal mass.

Limited intracranial: Unremarkable.
IMPRESSION: Unremarkable maxillofacial CT. Clear paranasal sinuses and mastoid
air cells.

## 2019-10-31 ENCOUNTER — Ambulatory Visit: Payer: Medicare Other | Attending: Internal Medicine

## 2019-10-31 DIAGNOSIS — Z23 Encounter for immunization: Secondary | ICD-10-CM

## 2019-10-31 NOTE — Progress Notes (Signed)
   Covid-19 Vaccination Clinic  Name:  Rickey Clark    MRN: [id number] DOB: May 24, 1951  10/31/2019  Rickey Clark was observed post Covid-19 immunization for 15 minutes without incidence. He was provided with Vaccine Information Sheet and instruction to access the V-Safe system.   Rickey Clark was instructed to call 911 with any severe reactions post vaccine: Marland Kitchen Difficulty breathing  . Swelling of your face and throat  . A fast heartbeat  . A bad rash all over your body  . Dizziness and weakness    Immunizations Administered    Name Date Dose VIS Date Route   Pfizer COVID-19 Vaccine 10/31/2019 12:45 PM 0.3 mL 08/15/2019 Intramuscular   Manufacturer: [employer], [employer]   Lot: [id number]   NDC: [address]-8

## 2019-11-25 ENCOUNTER — Ambulatory Visit: Payer: Medicare Other | Attending: Internal Medicine

## 2019-11-25 DIAGNOSIS — Z23 Encounter for immunization: Secondary | ICD-10-CM

## 2019-11-25 NOTE — Progress Notes (Signed)
   Covid-19 Vaccination Clinic  Name:  Rickey Clark    MRN: [id number] DOB: Apr 04, 1951  11/25/2019  Rickey Clark was observed post Covid-19 immunization for 15 minutes without incident. He was provided with Vaccine Information Sheet and instruction to access the V-Safe system.   Rickey Clark was instructed to call 911 with any severe reactions post vaccine: Marland Kitchen Difficulty breathing  . Swelling of face and throat  . A fast heartbeat  . A bad rash all over body  . Dizziness and weakness   Immunizations Administered    Name Date Dose VIS Date Route   Pfizer COVID-19 Vaccine 11/25/2019  3:53 PM 0.3 mL 08/15/2019 Intramuscular   Manufacturer: [employer], [employer]   Lot: [id number]   NDC: [address]-8

## 2022-06-04 DEATH — deceased
